# Patient Record
Sex: Female | Born: 1955
Health system: Southern US, Community
[De-identification: ages and names within clinical notes are randomized; demographics above are authoritative.]

---

## 1999-12-31 ENCOUNTER — Encounter: Admission: RE | Admit: 1999-12-31 | Discharge: 1999-12-31 | Payer: Self-pay | Admitting: Family Medicine

## 1999-12-31 ENCOUNTER — Encounter: Payer: Self-pay | Admitting: Family Medicine

## 2000-06-17 ENCOUNTER — Encounter: Admission: RE | Admit: 2000-06-17 | Discharge: 2000-06-17 | Payer: Self-pay | Admitting: Family Medicine

## 2000-06-17 ENCOUNTER — Encounter: Payer: Self-pay | Admitting: Family Medicine

## 2001-08-23 ENCOUNTER — Other Ambulatory Visit: Admission: RE | Admit: 2001-08-23 | Discharge: 2001-08-23 | Payer: Self-pay | Admitting: Gynecology

## 2001-10-25 ENCOUNTER — Encounter: Payer: Self-pay | Admitting: Family Medicine

## 2001-10-25 ENCOUNTER — Encounter: Admission: RE | Admit: 2001-10-25 | Discharge: 2001-10-25 | Payer: Self-pay | Admitting: Family Medicine

## 2003-08-03 ENCOUNTER — Emergency Department (HOSPITAL_COMMUNITY): Admission: AD | Admit: 2003-08-03 | Discharge: 2003-08-03 | Payer: Self-pay | Admitting: Emergency Medicine

## 2004-02-12 ENCOUNTER — Other Ambulatory Visit: Admission: RE | Admit: 2004-02-12 | Discharge: 2004-02-12 | Payer: Self-pay | Admitting: Family Medicine

## 2004-04-22 ENCOUNTER — Encounter: Admission: RE | Admit: 2004-04-22 | Discharge: 2004-04-22 | Payer: Self-pay | Admitting: Family Medicine

## 2004-09-21 ENCOUNTER — Other Ambulatory Visit: Admission: RE | Admit: 2004-09-21 | Discharge: 2004-09-21 | Payer: Self-pay | Admitting: Obstetrics and Gynecology

## 2005-07-29 ENCOUNTER — Other Ambulatory Visit: Admission: RE | Admit: 2005-07-29 | Discharge: 2005-07-29 | Payer: Self-pay | Admitting: Family Medicine

## 2006-09-12 ENCOUNTER — Encounter: Admission: RE | Admit: 2006-09-12 | Discharge: 2006-09-12 | Payer: Self-pay | Admitting: Family Medicine

## 2007-02-17 ENCOUNTER — Other Ambulatory Visit: Admission: RE | Admit: 2007-02-17 | Discharge: 2007-02-17 | Payer: Self-pay | Admitting: Family Medicine

## 2007-10-24 ENCOUNTER — Encounter: Admission: RE | Admit: 2007-10-24 | Discharge: 2007-10-24 | Payer: Self-pay | Admitting: Family Medicine

## 2008-02-21 ENCOUNTER — Other Ambulatory Visit: Admission: RE | Admit: 2008-02-21 | Discharge: 2008-02-21 | Payer: Self-pay | Admitting: Family Medicine

## 2008-10-24 ENCOUNTER — Encounter: Admission: RE | Admit: 2008-10-24 | Discharge: 2008-10-24 | Payer: Self-pay | Admitting: Family Medicine

## 2009-05-08 ENCOUNTER — Other Ambulatory Visit: Admission: RE | Admit: 2009-05-08 | Discharge: 2009-05-08 | Payer: Self-pay | Admitting: Family Medicine

## 2009-10-28 ENCOUNTER — Encounter: Admission: RE | Admit: 2009-10-28 | Discharge: 2009-10-28 | Payer: Self-pay | Admitting: Family Medicine

## 2010-07-24 ENCOUNTER — Other Ambulatory Visit: Admission: RE | Admit: 2010-07-24 | Discharge: 2010-07-24 | Payer: Self-pay | Admitting: Family Medicine

## 2010-12-14 ENCOUNTER — Encounter
Admission: RE | Admit: 2010-12-14 | Discharge: 2010-12-14 | Payer: Self-pay | Source: Home / Self Care | Attending: Family Medicine | Admitting: Family Medicine

## 2011-12-13 ENCOUNTER — Other Ambulatory Visit: Payer: Self-pay | Admitting: Family Medicine

## 2011-12-13 DIAGNOSIS — Z1231 Encounter for screening mammogram for malignant neoplasm of breast: Secondary | ICD-10-CM

## 2011-12-16 ENCOUNTER — Ambulatory Visit
Admission: RE | Admit: 2011-12-16 | Discharge: 2011-12-16 | Disposition: A | Discharge status: Home / Self Care | Payer: BC Managed Care – PPO | Source: Ambulatory Visit | Attending: Family Medicine | Admitting: Family Medicine

## 2011-12-16 DIAGNOSIS — Z1231 Encounter for screening mammogram for malignant neoplasm of breast: Secondary | ICD-10-CM

## 2012-01-16 ENCOUNTER — Ambulatory Visit: Payer: Worker's Compensation

## 2012-11-27 ENCOUNTER — Other Ambulatory Visit: Payer: Self-pay | Admitting: Family Medicine

## 2012-11-27 DIAGNOSIS — Z1231 Encounter for screening mammogram for malignant neoplasm of breast: Secondary | ICD-10-CM

## 2012-12-18 ENCOUNTER — Ambulatory Visit
Admission: RE | Admit: 2012-12-18 | Discharge: 2012-12-18 | Disposition: A | Discharge status: Home / Self Care | Payer: BC Managed Care – PPO | Source: Ambulatory Visit | Attending: Family Medicine | Admitting: Family Medicine

## 2012-12-18 DIAGNOSIS — Z1231 Encounter for screening mammogram for malignant neoplasm of breast: Secondary | ICD-10-CM

## 2013-11-28 ENCOUNTER — Other Ambulatory Visit: Payer: Self-pay

## 2013-11-28 DIAGNOSIS — Z1231 Encounter for screening mammogram for malignant neoplasm of breast: Secondary | ICD-10-CM

## 2013-12-05 ENCOUNTER — Other Ambulatory Visit (HOSPITAL_COMMUNITY)
Admission: RE | Admit: 2013-12-05 | Discharge: 2013-12-05 | Disposition: A | Discharge status: Home / Self Care | Payer: BC Managed Care – PPO | Source: Ambulatory Visit | Attending: Family Medicine | Admitting: Family Medicine

## 2013-12-05 ENCOUNTER — Other Ambulatory Visit: Payer: Self-pay | Admitting: Family Medicine

## 2013-12-05 DIAGNOSIS — Z124 Encounter for screening for malignant neoplasm of cervix: Secondary | ICD-10-CM | POA: Insufficient documentation

## 2013-12-10 ENCOUNTER — Ambulatory Visit: Payer: BC Managed Care – PPO | Attending: Family Medicine | Admitting: Physical Therapy

## 2013-12-10 DIAGNOSIS — M629 Disorder of muscle, unspecified: Secondary | ICD-10-CM | POA: Insufficient documentation

## 2013-12-10 DIAGNOSIS — IMO0001 Reserved for inherently not codable concepts without codable children: Secondary | ICD-10-CM | POA: Insufficient documentation

## 2013-12-10 DIAGNOSIS — M2420 Disorder of ligament, unspecified site: Secondary | ICD-10-CM | POA: Insufficient documentation

## 2013-12-26 ENCOUNTER — Ambulatory Visit: Payer: BC Managed Care – PPO | Admitting: Physical Therapy

## 2013-12-28 ENCOUNTER — Ambulatory Visit
Admission: RE | Admit: 2013-12-28 | Discharge: 2013-12-28 | Disposition: A | Discharge status: Home / Self Care | Payer: BC Managed Care – PPO | Source: Ambulatory Visit

## 2013-12-28 ENCOUNTER — Other Ambulatory Visit: Payer: Self-pay

## 2013-12-28 DIAGNOSIS — Z1231 Encounter for screening mammogram for malignant neoplasm of breast: Secondary | ICD-10-CM

## 2014-01-04 ENCOUNTER — Ambulatory Visit: Payer: BC Managed Care – PPO | Attending: Family Medicine | Admitting: Physical Therapy

## 2014-01-04 DIAGNOSIS — M629 Disorder of muscle, unspecified: Secondary | ICD-10-CM | POA: Insufficient documentation

## 2014-01-04 DIAGNOSIS — M2420 Disorder of ligament, unspecified site: Secondary | ICD-10-CM | POA: Insufficient documentation

## 2014-01-04 DIAGNOSIS — IMO0001 Reserved for inherently not codable concepts without codable children: Secondary | ICD-10-CM | POA: Insufficient documentation

## 2014-01-18 ENCOUNTER — Ambulatory Visit: Payer: BC Managed Care – PPO | Admitting: Physical Therapy

## 2014-01-28 ENCOUNTER — Encounter: Payer: BC Managed Care – PPO | Attending: Family Medicine | Admitting: Dietician

## 2014-01-28 ENCOUNTER — Encounter: Payer: Self-pay | Admitting: Dietician

## 2014-01-28 ENCOUNTER — Ambulatory Visit: Payer: BC Managed Care – PPO | Admitting: Dietician

## 2014-01-28 VITALS — Ht 62.0 in | Wt 167.1 lb

## 2014-01-28 DIAGNOSIS — E669 Obesity, unspecified: Secondary | ICD-10-CM

## 2014-01-28 DIAGNOSIS — R7309 Other abnormal glucose: Secondary | ICD-10-CM | POA: Insufficient documentation

## 2014-01-28 NOTE — Patient Instructions (Addendum)
Goals:  Eat 3 meals/day, Avoid meal skipping   Per meal: 3 carb servings (45 grams per meal).  1 carb serving for snacks   Aim for 30 minutes or more of physical activity daily  Practice reading food labels and counting serving sizes and servings of carbohydrates  Keep checking blood sugars and writing down foods

## 2014-01-28 NOTE — Progress Notes (Signed)
  Medical Nutrition Therapy:  Appt start time: 300 end time: 415   Assessment:  Kaziah is here today for counseling regarding prediabetes diagnosis (HgbA1c of 6.4%). She already has some basic knowledge of diabetes and understands carbohydrate and protein foods. She has already been cutting down on carbs and measuring/recording her blood sugars. She did not bring her records but reports that her fasting BG has been under 100. She states that she has never eaten many sweets.  Preferred Learning Style:    No preference indicated    Learning Readiness:    Ready   MEDICATIONS: none   DIETARY INTAKE:  24-hr recall:  B ( AM): sometimes skips; cereal; egg on biscuit Snk ( AM): salad with meat and [place] dressing;  L ( PM): oranges, apple, rice stew; pizza Snk ( PM): sometimes D (8-9 PM): rice and chicken Jambalaya with yogurt Snk (PM): celery and peanut butter Beverages: 8+ milk, green tea, water  Usual physical activity: walks on the treadmill or outside for 30 minutes 3x a week  Estimated energy needs: 1800 calories  Progress Towards Goal(s):  In progress.   Nutritional Diagnosis:  NI-5.8.2 Excessive carbohydrate intake As related to pt reports high intake of milk and fruit and starches.  As evidenced by Hgb A1c of 6.4%.    Intervention:  Nutrition education provided.  Teaching Method Utilized: Visual Auditory Hands on  Handouts given during visit include:  Carb choices card  Living Well with Diabetes Booklet  Barriers to learning/adherence to lifestyle change: none  Demonstrated degree of understanding via:  Teach Back   Monitoring/Evaluation:  Dietary intake, exercise, carb counting, and body weight in 6 week(s).

## 2014-02-01 ENCOUNTER — Ambulatory Visit: Payer: BC Managed Care – PPO | Attending: Family Medicine | Admitting: Physical Therapy

## 2014-02-01 DIAGNOSIS — IMO0001 Reserved for inherently not codable concepts without codable children: Secondary | ICD-10-CM | POA: Insufficient documentation

## 2014-02-01 DIAGNOSIS — M2420 Disorder of ligament, unspecified site: Secondary | ICD-10-CM | POA: Insufficient documentation

## 2014-02-01 DIAGNOSIS — M629 Disorder of muscle, unspecified: Secondary | ICD-10-CM | POA: Insufficient documentation

## 2014-02-19 ENCOUNTER — Encounter: Payer: Self-pay | Admitting: Family Medicine

## 2015-01-07 ENCOUNTER — Other Ambulatory Visit: Payer: Self-pay

## 2015-01-07 DIAGNOSIS — Z1231 Encounter for screening mammogram for malignant neoplasm of breast: Secondary | ICD-10-CM

## 2015-01-09 ENCOUNTER — Ambulatory Visit
Admission: RE | Admit: 2015-01-09 | Discharge: 2015-01-09 | Disposition: A | Discharge status: Home / Self Care | Payer: BLUE CROSS/BLUE SHIELD | Source: Ambulatory Visit

## 2015-01-09 DIAGNOSIS — Z1231 Encounter for screening mammogram for malignant neoplasm of breast: Secondary | ICD-10-CM

## 2016-02-11 ENCOUNTER — Other Ambulatory Visit: Payer: Self-pay

## 2016-02-11 DIAGNOSIS — Z1231 Encounter for screening mammogram for malignant neoplasm of breast: Secondary | ICD-10-CM

## 2016-02-25 ENCOUNTER — Ambulatory Visit: Payer: BLUE CROSS/BLUE SHIELD

## 2016-03-05 ENCOUNTER — Ambulatory Visit
Admission: RE | Admit: 2016-03-05 | Discharge: 2016-03-05 | Disposition: A | Discharge status: Home / Self Care | Payer: Managed Care, Other (non HMO) | Source: Ambulatory Visit

## 2016-03-05 DIAGNOSIS — Z1231 Encounter for screening mammogram for malignant neoplasm of breast: Secondary | ICD-10-CM

## 2016-05-05 ENCOUNTER — Other Ambulatory Visit: Payer: Self-pay | Admitting: Gastroenterology

## 2017-01-05 ENCOUNTER — Other Ambulatory Visit (HOSPITAL_COMMUNITY)
Admission: RE | Admit: 2017-01-05 | Discharge: 2017-01-05 | Disposition: A | Discharge status: Home / Self Care | Payer: BLUE CROSS/BLUE SHIELD | Source: Ambulatory Visit | Attending: Family Medicine | Admitting: Family Medicine

## 2017-01-05 ENCOUNTER — Other Ambulatory Visit: Payer: Self-pay | Admitting: Family Medicine

## 2017-01-05 DIAGNOSIS — Z01411 Encounter for gynecological examination (general) (routine) with abnormal findings: Secondary | ICD-10-CM | POA: Diagnosis not present

## 2017-01-05 DIAGNOSIS — E7800 Pure hypercholesterolemia, unspecified: Secondary | ICD-10-CM | POA: Diagnosis not present

## 2017-01-05 DIAGNOSIS — E119 Type 2 diabetes mellitus without complications: Secondary | ICD-10-CM | POA: Diagnosis not present

## 2017-01-05 DIAGNOSIS — Z0000 Encounter for general adult medical examination without abnormal findings: Secondary | ICD-10-CM | POA: Diagnosis not present

## 2017-01-07 LAB — CYTOLOGY - PAP: Diagnosis: NEGATIVE

## 2017-04-22 DIAGNOSIS — M545 Low back pain: Secondary | ICD-10-CM | POA: Diagnosis not present

## 2017-04-22 DIAGNOSIS — M1990 Unspecified osteoarthritis, unspecified site: Secondary | ICD-10-CM | POA: Diagnosis not present

## 2017-04-22 DIAGNOSIS — M65311 Trigger thumb, right thumb: Secondary | ICD-10-CM | POA: Diagnosis not present

## 2017-07-04 DIAGNOSIS — E7800 Pure hypercholesterolemia, unspecified: Secondary | ICD-10-CM | POA: Diagnosis not present

## 2017-07-04 DIAGNOSIS — M65311 Trigger thumb, right thumb: Secondary | ICD-10-CM | POA: Diagnosis not present

## 2017-07-04 DIAGNOSIS — E669 Obesity, unspecified: Secondary | ICD-10-CM | POA: Diagnosis not present

## 2017-07-04 DIAGNOSIS — E119 Type 2 diabetes mellitus without complications: Secondary | ICD-10-CM | POA: Diagnosis not present

## 2017-10-10 ENCOUNTER — Ambulatory Visit
Admission: RE | Admit: 2017-10-10 | Discharge: 2017-10-10 | Disposition: A | Discharge status: Home / Self Care | Payer: BLUE CROSS/BLUE SHIELD | Source: Ambulatory Visit | Attending: Family Medicine | Admitting: Family Medicine

## 2017-10-10 ENCOUNTER — Other Ambulatory Visit: Payer: Self-pay | Admitting: Family Medicine

## 2017-10-10 DIAGNOSIS — Z1231 Encounter for screening mammogram for malignant neoplasm of breast: Secondary | ICD-10-CM

## 2017-10-15 ENCOUNTER — Ambulatory Visit (INDEPENDENT_AMBULATORY_CARE_PROVIDER_SITE_OTHER): Payer: Worker's Compensation

## 2017-10-15 ENCOUNTER — Ambulatory Visit (HOSPITAL_COMMUNITY)
Admission: EM | Admit: 2017-10-15 | Discharge: 2017-10-15 | Disposition: A | Discharge status: Home / Self Care | Payer: Worker's Compensation | Attending: Family Medicine | Admitting: Family Medicine

## 2017-10-15 ENCOUNTER — Encounter (HOSPITAL_COMMUNITY): Payer: Self-pay

## 2017-10-15 DIAGNOSIS — M25561 Pain in right knee: Secondary | ICD-10-CM

## 2017-10-15 DIAGNOSIS — S8391XA Sprain of unspecified site of right knee, initial encounter: Secondary | ICD-10-CM

## 2017-10-15 NOTE — ED Triage Notes (Signed)
Pt presents today with a workers comp issue. 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 She is having right knee pain due to slipping forward and catching herself with the right leg.

## 2017-10-15 NOTE — ED Provider Notes (Signed)
[facility]    CSN: [id number] Arrival date & time: 10/15/17  1708     History   Chief Complaint Chief Complaint  Patient presents with  . Knee Pain    HPI Rachael Clayton is a 61 y.o. female.   61 year old female was at work today and she was on a wet floor and slipped. Her right leg went in front of her with her knee flexed and her weight was placed primarily over the right knee. She did not actually hit the floor. The knee did not strike any objects. She states she believes she somehow strained or sprained her knee. She is complaining of right knee pain. She was unable to work the remainder of the shift after this occurred. This is workman'scomp.      History reviewed. No pertinent past medical history.  There are no active problems to display for this patient.   History reviewed. No pertinent surgical history.  OB History    No data available       Home Medications    Prior to Admission medications   Not on File    Family History No family history on file.  Social History Social History  Substance Use Topics  . Smoking status: Never Smoker  . Smokeless tobacco: Never Used  . Alcohol use No     Allergies   Tetracyclines & related   Review of Systems Review of Systems  Constitutional: Negative for activity change, chills and fever.  HENT: Negative.   Respiratory: Negative.   Cardiovascular: Negative.   Musculoskeletal:       As per HPI  Skin: Negative for color change, pallor and rash.  Neurological: Negative.   All other systems reviewed and are negative.    Physical Exam Triage Vital Signs ED Triage Vitals  Enc Vitals Group     BP 10/15/17 1759 135/75     Pulse Rate 10/15/17 1759 65     Resp 10/15/17 1759 16     Temp 10/15/17 1759 98.2 F (36.8 C)     Temp Source 10/15/17 1759 Oral     SpO2 10/15/17 1759 100 %     Weight --      Height --      Head Circumference --      Peak Flow --      Pain Score 10/15/17  1802 8     Pain Loc --      Pain Edu? --      Excl. in [place]? --    No data found.   Updated Vital Signs BP 135/75 (BP Location: Right Arm)   Pulse 65   Temp 98.2 F (36.8 C) (Oral)   Resp 16   SpO2 100%   Visual Acuity Right Eye Distance:   Left Eye Distance:   Bilateral Distance:    Right Eye Near:   Left Eye Near:    Bilateral Near:     Physical Exam  Constitutional: She is oriented to person, place, and time. She appears well-developed and well-nourished. No distress.  HENT:  Head: Normocephalic and atraumatic.  Eyes: EOM are normal. Right eye exhibits no discharge. Left eye exhibits no discharge.  Neck: Neck supple.  Pulmonary/Chest: Effort normal. No respiratory distress.  Musculoskeletal: Normal range of motion. She exhibits no edema.  Right knee with no deformity. No discoloration. There is mild swelling particular he palpated at the medial and lateral joint spaces. No deformity. Extension to 180 without limitation. Flexion to 80.  Palpation reveals tenderness over the lateral tibial condyle and vaguely over the anterior knee. No direct tenderness over the patella. Negative varus, negative valgus, negative drawer.  Neurological: She is alert and oriented to person, place, and time. No cranial nerve deficit.  Skin: Skin is warm and dry.     UC Treatments / Results  Labs (all labs ordered are listed, but only abnormal results are displayed) Labs Reviewed - No data to display  EKG  EKG Interpretation None       Radiology Dg Knee Complete 4 Views Right  Result Date: 10/15/2017 CLINICAL DATA:  Recent slip and fall with right knee pain, initial encounter EXAM: RIGHT KNEE - COMPLETE 4+ VIEW COMPARISON:  None. FINDINGS: Small joint effusion is noted. Tricompartmental degenerative changes are noted. No acute fracture or dislocation is noted. IMPRESSION: Mild degenerative change and small joint effusion. Electronically Signed   By: Inez Catalina M.D.   On:  10/15/2017 19:39    Procedures Procedures (including critical care time)  Medications Ordered in UC Medications - No data to display   Initial Impression / Assessment and Plan / UC Course  I have reviewed the triage vital signs and the nursing notes.  Pertinent labs & imaging results that were available during my care of the patient were reviewed by me and considered in my medical decision making (see chart for details).    Limit weightbearing until you see your orthopedist. In the meantime apply ice to the knee off and on. May take ibuprofen and Tylenol for pain.He may remove the knee brace periodically and bend and straightening her knee to keep it loose.     Final Clinical Impressions(s) / UC Diagnoses   Final diagnoses:  Sprain of right knee, unspecified ligament, initial encounter    New Prescriptions New Prescriptions   No medications on file     Controlled Substance Prescriptions [place] Controlled Substance Registry consulted? Not Applicable   Janne Napoleon, NP 10/15/17 [phone]

## 2017-10-15 NOTE — Discharge Instructions (Signed)
Limit weightbearing until you see your orthopedist. In the meantime apply ice to the knee off and on. May take ibuprofen and Tylenol for pain.He may remove the knee brace periodically and bend and straightening her knee to keep it loose.

## 2017-10-21 ENCOUNTER — Ambulatory Visit (INDEPENDENT_AMBULATORY_CARE_PROVIDER_SITE_OTHER): Payer: Self-pay | Admitting: Orthopaedic Surgery

## 2017-12-06 DIAGNOSIS — E7800 Pure hypercholesterolemia, unspecified: Secondary | ICD-10-CM | POA: Diagnosis not present

## 2018-01-25 DIAGNOSIS — E7800 Pure hypercholesterolemia, unspecified: Secondary | ICD-10-CM | POA: Diagnosis not present

## 2018-01-25 DIAGNOSIS — Z23 Encounter for immunization: Secondary | ICD-10-CM | POA: Diagnosis not present

## 2018-01-25 DIAGNOSIS — E119 Type 2 diabetes mellitus without complications: Secondary | ICD-10-CM | POA: Diagnosis not present

## 2018-01-25 DIAGNOSIS — Z0000 Encounter for general adult medical examination without abnormal findings: Secondary | ICD-10-CM | POA: Diagnosis not present

## 2018-02-07 DIAGNOSIS — E875 Hyperkalemia: Secondary | ICD-10-CM | POA: Diagnosis not present

## 2018-02-07 DIAGNOSIS — E870 Hyperosmolality and hypernatremia: Secondary | ICD-10-CM | POA: Diagnosis not present

## 2018-07-31 DIAGNOSIS — E119 Type 2 diabetes mellitus without complications: Secondary | ICD-10-CM | POA: Diagnosis not present

## 2018-07-31 DIAGNOSIS — E669 Obesity, unspecified: Secondary | ICD-10-CM | POA: Diagnosis not present

## 2018-07-31 DIAGNOSIS — E7800 Pure hypercholesterolemia, unspecified: Secondary | ICD-10-CM | POA: Diagnosis not present

## 2018-09-03 ENCOUNTER — Ambulatory Visit (HOSPITAL_COMMUNITY)
Admission: EM | Admit: 2018-09-03 | Discharge: 2018-09-03 | Disposition: A | Discharge status: Home / Self Care | Payer: Worker's Compensation | Attending: Internal Medicine | Admitting: Internal Medicine

## 2018-09-03 ENCOUNTER — Encounter (HOSPITAL_COMMUNITY): Payer: Self-pay

## 2018-09-03 ENCOUNTER — Ambulatory Visit (INDEPENDENT_AMBULATORY_CARE_PROVIDER_SITE_OTHER): Payer: Worker's Compensation

## 2018-09-03 DIAGNOSIS — M25562 Pain in left knee: Secondary | ICD-10-CM | POA: Diagnosis not present

## 2018-09-03 MED ORDER — NAPROXEN 500 MG PO TABS: 500.0000 mg | ORAL_TABLET | Freq: Two times a day (BID) | ORAL | 0 refills | Status: DC

## 2018-09-03 NOTE — Discharge Instructions (Signed)
Use anti-inflammatories for pain/swelling. You may take up to 800 mg Ibuprofen every 8 hours OR Naprosyn twice daily with food. You may supplement Ibuprofen with Tylenol [phone] mg every 8 hours.   Ice knee, elevate when resting  Please follow-up if pain not improving as expected over the next 1 to 2 weeks, developing new symptoms, worsening pain, leg swelling or redness

## 2018-09-03 NOTE — ED Provider Notes (Signed)
[facility]    CSN: [id number] Arrival date & time: 09/03/18  1049     History   Chief Complaint Chief Complaint  Patient presents with  . Knee Pain    Left    HPI Rachael Clayton is a 62 y.o. female no significant past medical history presenting today for evaluation of left knee injury.  Patient states that yesterday at work she caught her parents on a spinning chair and ended up falling forward.  She landed on her left knee onto cement.  Since she has had pain especially below her left knee and has been limping.  She denies difficulty bending knee, but does have pain with this.  She has been using ice.  Denies previous issues with the knee.  Denies any sensations of popping or instability.  Injury happened Friday, 2 days ago.  HPI  History reviewed. No pertinent past medical history.  There are no active problems to display for this patient.   History reviewed. No pertinent surgical history.  OB History   None      Home Medications    Prior to Admission medications   Medication Sig Start Date End Date Taking? Authorizing Provider  naproxen (NAPROSYN) 500 MG tablet Take 1 tablet (500 mg total) by mouth 2 (two) times daily. 09/03/18   Lamyra Malcolm, Elesa Hacker, PA-C    Family History History reviewed. No pertinent family history.  Social History Social History   Tobacco Use  . Smoking status: Never Smoker  . Smokeless tobacco: Never Used  Substance Use Topics  . Alcohol use: No  . Drug use: No     Allergies   Tetracyclines & related   Review of Systems Review of Systems  Constitutional: Negative for fatigue and fever.  Eyes: Negative for visual disturbance.  Respiratory: Negative for shortness of breath.   Cardiovascular: Negative for chest pain.  Gastrointestinal: Negative for abdominal pain, nausea and vomiting.  Musculoskeletal: Positive for arthralgias, gait problem and joint swelling.  Skin: Positive for color change. Negative for rash and  wound.  Neurological: Negative for dizziness, weakness, light-headedness and headaches.     Physical Exam Triage Vital Signs ED Triage Vitals  Enc Vitals Group     BP 09/03/18 1253 101/68     Pulse Rate 09/03/18 1253 62     Resp 09/03/18 1253 20     Temp 09/03/18 1253 98 F (36.7 C)     Temp Source 09/03/18 1253 Oral     SpO2 09/03/18 1253 97 %     Weight --      Height --      Head Circumference --      Peak Flow --      Pain Score 09/03/18 1252 6     Pain Loc --      Pain Edu? --      Excl. in [place]? --    No data found.  Updated Vital Signs BP 101/68 (BP Location: Right Arm)   Pulse 62   Temp 98 F (36.7 C) (Oral)   Resp 20   SpO2 97%   Visual Acuity Right Eye Distance:   Left Eye Distance:   Bilateral Distance:    Right Eye Near:   Left Eye Near:    Bilateral Near:     Physical Exam  Constitutional: She is oriented to person, place, and time. She appears well-developed and well-nourished.  No acute distress  HENT:  Head: Normocephalic and atraumatic.  Nose: Nose normal.  Eyes: Conjunctivae are normal.  Neck: Neck supple.  Cardiovascular: Normal rate.  Pulmonary/Chest: Effort normal. No respiratory distress.  Abdominal: She exhibits no distension.  Musculoskeletal: Normal range of motion.  Mild bruising and swelling near tibial tubercle below left knee, mild suprapatellar swelling, full active range of motion, no laxity appreciated with varus and valgus stress or with Lachman/anterior drawer.  Knee feels stable.  Antalgic gait  Neurological: She is alert and oriented to person, place, and time.  Skin: Skin is warm and dry.  Psychiatric: She has a normal mood and affect.  Nursing note and vitals reviewed.    UC Treatments / Results  Labs (all labs ordered are listed, but only abnormal results are displayed) Labs Reviewed - No data to display  EKG None  Radiology Dg Knee Complete 4 Views Left  Result Date: 09/03/2018 CLINICAL DATA:  Fall 2  days ago.  Left knee pain. EXAM: LEFT KNEE - COMPLETE 4+ VIEW COMPARISON:  None. FINDINGS: The left located. No acute bone or soft tissue abnormalities are present. Mild degenerative changes are noted in the medial and patellofemoral compartments. IMPRESSION: Mild degenerative change without acute abnormality. Electronically Signed   By: San Morelle M.D.   On: 09/03/2018 13:51    Procedures Procedures (including critical care time)  Medications Ordered in UC Medications - No data to display  Initial Impression / Assessment and Plan / UC Course  I have reviewed the triage vital signs and the nursing notes.  Pertinent labs & imaging results that were available during my care of the patient were reviewed by me and considered in my medical decision making (see chart for details).     Patient likely with contusion of knee from fall, no fracture seen on x-ray.  Cannot fully rule out ligamentous/cartilaginous injury, but knee felt stable and with minimal swelling on exam.  Recommending conservative treatment with anti-inflammatories and ice, expect gradual resolution over the next 1 to 2 weeks.Discussed strict return precautions. Patient verbalized understanding and is agreeable with plan.  Final Clinical Impressions(s) / UC Diagnoses   Final diagnoses:  Acute pain of left knee     Discharge Instructions     Use anti-inflammatories for pain/swelling. You may take up to 800 mg Ibuprofen every 8 hours OR Naprosyn twice daily with food. You may supplement Ibuprofen with Tylenol [phone] mg every 8 hours.   Ice knee, elevate when resting  Please follow-up if pain not improving as expected over the next 1 to 2 weeks, developing new symptoms, worsening pain, leg swelling or redness    ED Prescriptions    Medication Sig Dispense Auth. Provider   naproxen (NAPROSYN) 500 MG tablet Take 1 tablet (500 mg total) by mouth 2 (two) times daily. 30 tablet Kimon Loewen, [place] C, PA-C      Controlled Substance Prescriptions [place] Controlled Substance Registry consulted? Not Applicable   Janith Lima, [place] 09/03/18 1422

## 2018-09-03 NOTE — ED Triage Notes (Signed)
Pt presents with left knee injury from work related injury on 09/01/2018.

## 2018-11-22 ENCOUNTER — Other Ambulatory Visit: Payer: Self-pay | Admitting: Family Medicine

## 2018-11-22 ENCOUNTER — Ambulatory Visit
Admission: RE | Admit: 2018-11-22 | Discharge: 2018-11-22 | Disposition: A | Discharge status: Home / Self Care | Payer: BLUE CROSS/BLUE SHIELD | Source: Ambulatory Visit | Attending: Family Medicine | Admitting: Family Medicine

## 2018-11-22 DIAGNOSIS — Z1231 Encounter for screening mammogram for malignant neoplasm of breast: Secondary | ICD-10-CM | POA: Diagnosis not present

## 2020-01-29 ENCOUNTER — Other Ambulatory Visit: Payer: Self-pay | Admitting: Family Medicine

## 2020-01-29 ENCOUNTER — Ambulatory Visit
Admission: RE | Admit: 2020-01-29 | Discharge: 2020-01-29 | Disposition: A | Discharge status: Home / Self Care | Payer: BC Managed Care – PPO | Source: Ambulatory Visit | Attending: Family Medicine | Admitting: Family Medicine

## 2020-01-29 ENCOUNTER — Other Ambulatory Visit: Payer: Self-pay

## 2020-01-29 DIAGNOSIS — Z1231 Encounter for screening mammogram for malignant neoplasm of breast: Secondary | ICD-10-CM

## 2020-02-19 ENCOUNTER — Other Ambulatory Visit (HOSPITAL_COMMUNITY)
Admission: RE | Admit: 2020-02-19 | Discharge: 2020-02-19 | Disposition: A | Discharge status: Home / Self Care | Payer: BC Managed Care – PPO | Source: Ambulatory Visit | Attending: Family Medicine | Admitting: Family Medicine

## 2020-02-19 DIAGNOSIS — Z124 Encounter for screening for malignant neoplasm of cervix: Secondary | ICD-10-CM | POA: Diagnosis not present

## 2020-02-19 DIAGNOSIS — Z01411 Encounter for gynecological examination (general) (routine) with abnormal findings: Secondary | ICD-10-CM | POA: Insufficient documentation

## 2020-02-19 DIAGNOSIS — Z0000 Encounter for general adult medical examination without abnormal findings: Secondary | ICD-10-CM | POA: Diagnosis not present

## 2020-02-19 DIAGNOSIS — E119 Type 2 diabetes mellitus without complications: Secondary | ICD-10-CM | POA: Diagnosis not present

## 2020-02-19 DIAGNOSIS — E7800 Pure hypercholesterolemia, unspecified: Secondary | ICD-10-CM | POA: Diagnosis not present

## 2020-02-21 LAB — CYTOLOGY - PAP
Comment: NEGATIVE
Diagnosis: NEGATIVE
High risk HPV: NEGATIVE

## 2020-08-19 DIAGNOSIS — E7800 Pure hypercholesterolemia, unspecified: Secondary | ICD-10-CM | POA: Diagnosis not present

## 2020-08-19 DIAGNOSIS — E119 Type 2 diabetes mellitus without complications: Secondary | ICD-10-CM | POA: Diagnosis not present

## 2021-02-05 ENCOUNTER — Other Ambulatory Visit: Payer: Self-pay | Admitting: Family Medicine

## 2021-02-05 DIAGNOSIS — Z1231 Encounter for screening mammogram for malignant neoplasm of breast: Secondary | ICD-10-CM

## 2021-03-27 ENCOUNTER — Ambulatory Visit
Admission: RE | Admit: 2021-03-27 | Discharge: 2021-03-27 | Disposition: A | Discharge status: Home / Self Care | Payer: BC Managed Care – PPO | Source: Ambulatory Visit | Attending: Family Medicine | Admitting: Family Medicine

## 2021-03-27 ENCOUNTER — Other Ambulatory Visit: Payer: Self-pay

## 2021-03-27 DIAGNOSIS — Z1231 Encounter for screening mammogram for malignant neoplasm of breast: Secondary | ICD-10-CM

## 2021-04-08 IMAGING — MG MM DIGITAL SCREENING BILAT W/ TOMO AND CAD
8 series · 9 of 24 positions shown · non-contrast
Comparison: Previous exam(s).

CLINICAL DATA: Screening.

EXAM:
DIGITAL SCREENING BILATERAL MAMMOGRAM WITH TOMOSYNTHESIS AND CAD
TECHNIQUE: Bilateral screening digital craniocaudal and mediolateral oblique
mammograms were obtained. Bilateral screening digital breast
tomosynthesis was performed. The images were evaluated with
computer-aided detection.

[L CC synth-2D]
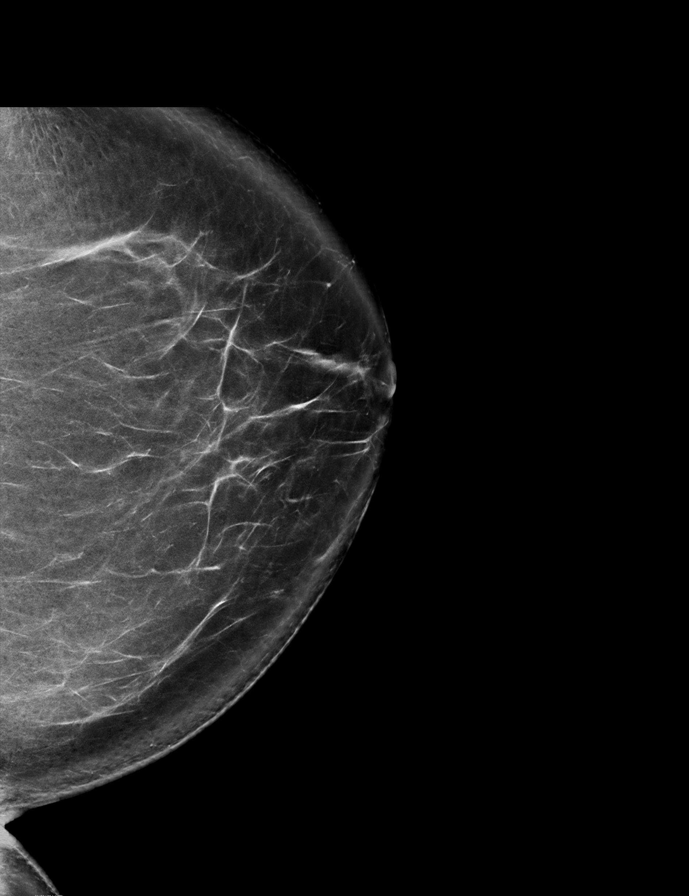

[L MLO synth-2D]
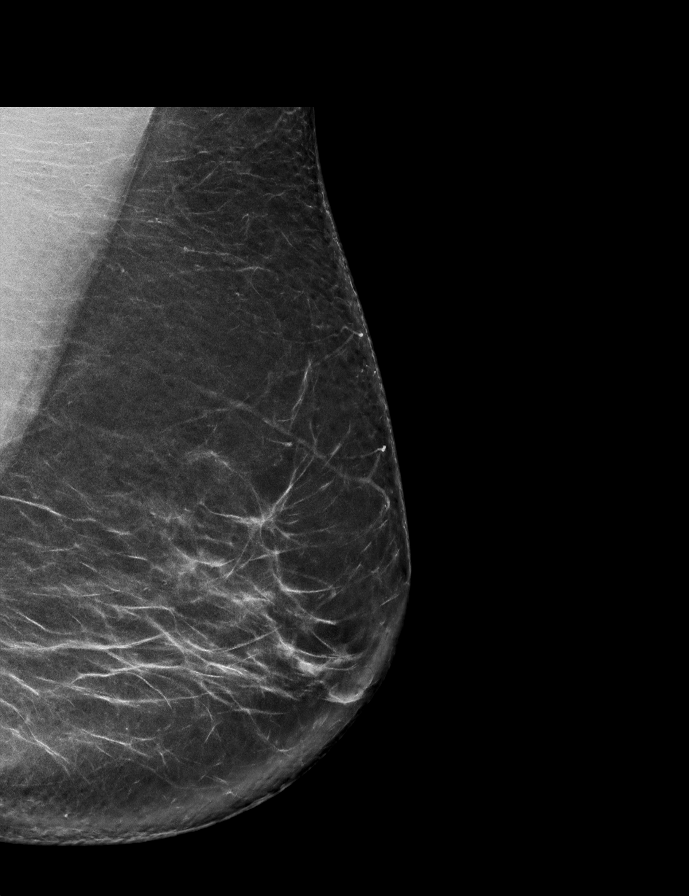

[R CC synth-2D]
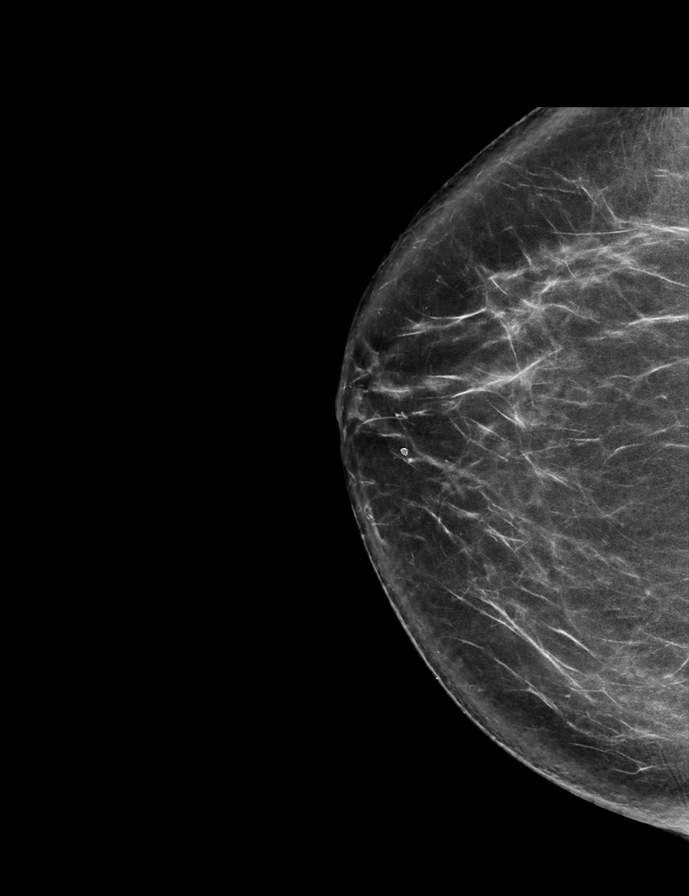

[R MLO synth-2D]
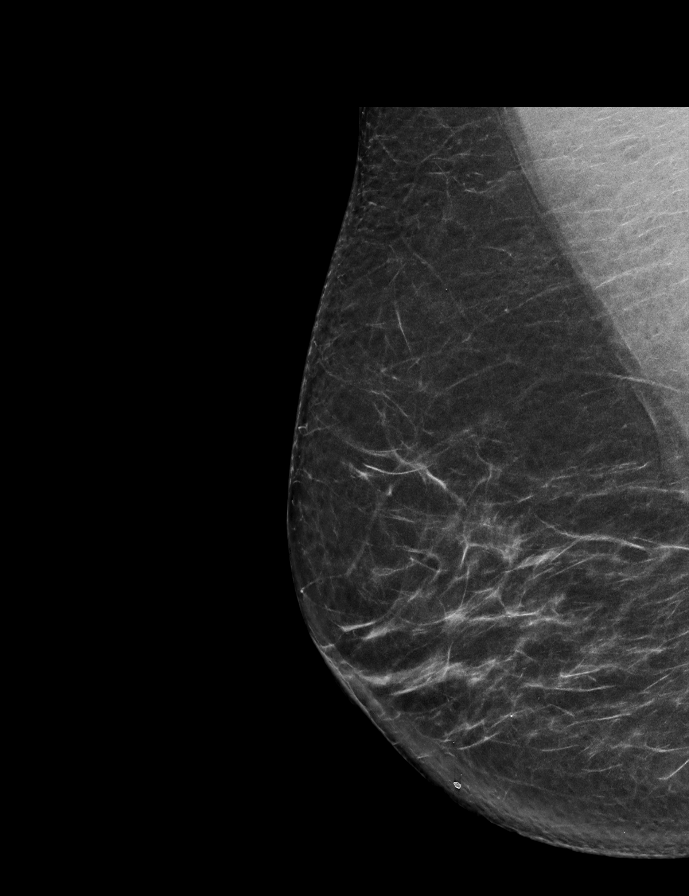

[R MLO tomo · 2 of 75 frames shown]
[frame 25/75]
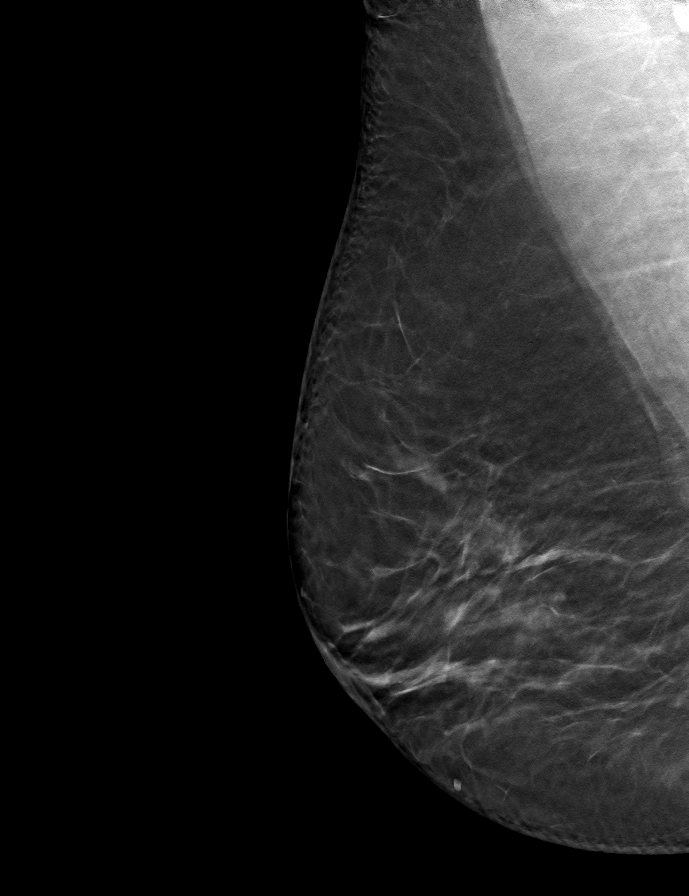
[frame 38/75]
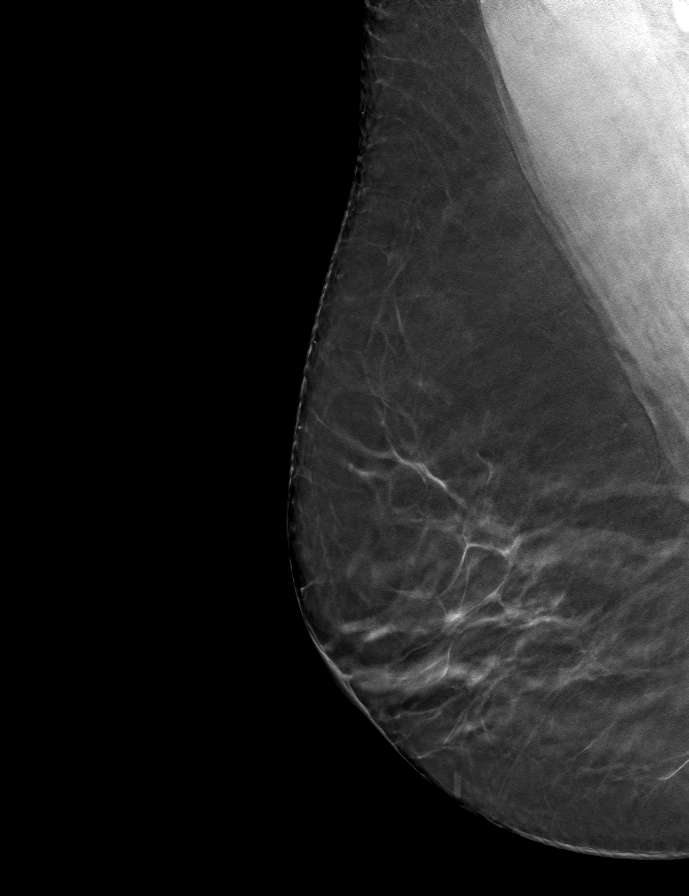

[R CC tomo · tomo slice 41/81.0]
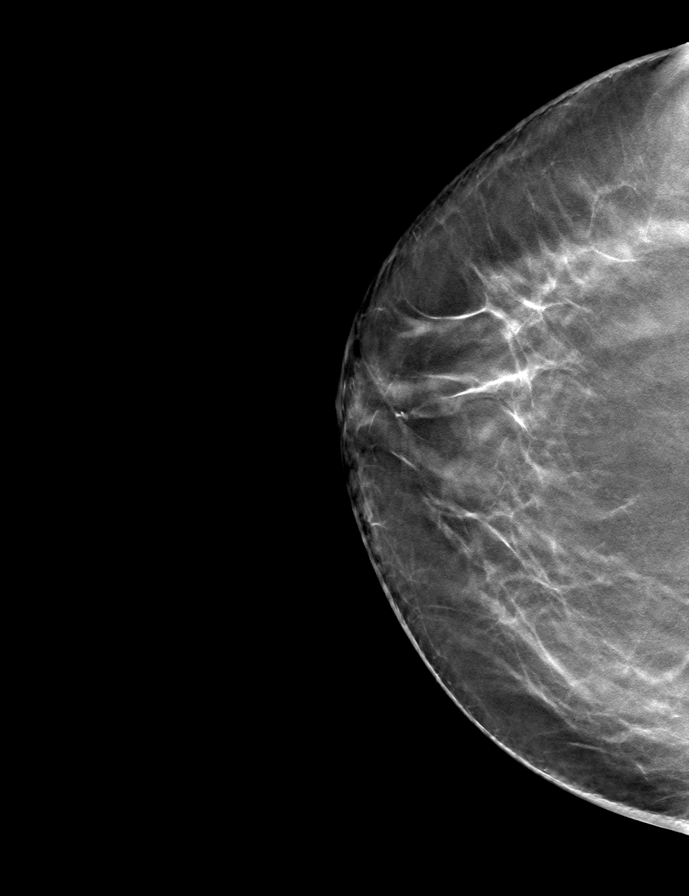

[L CC tomo · tomo slice 43/86.0]
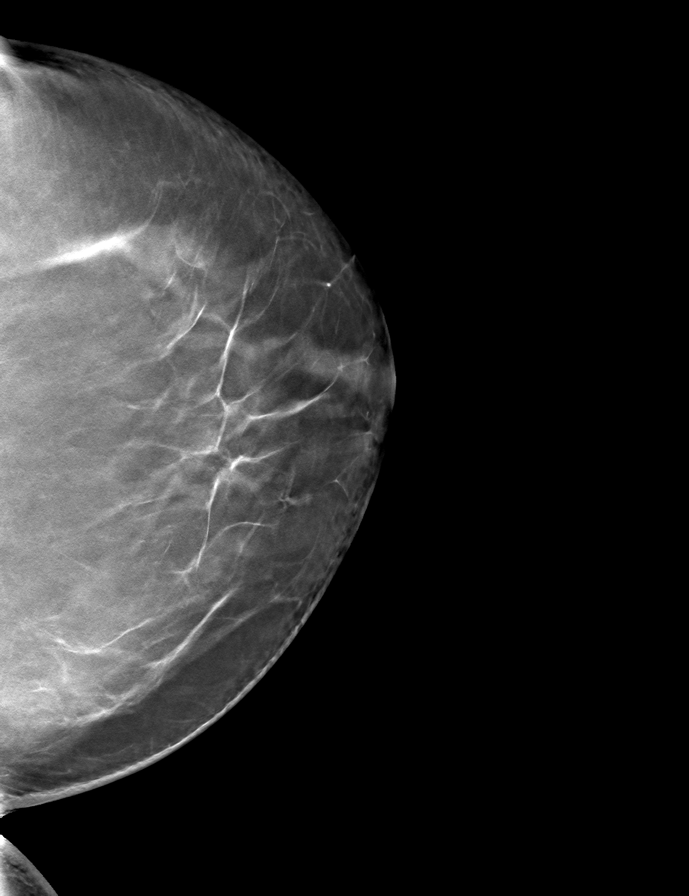

[L MLO tomo · tomo slice 41/82.0]
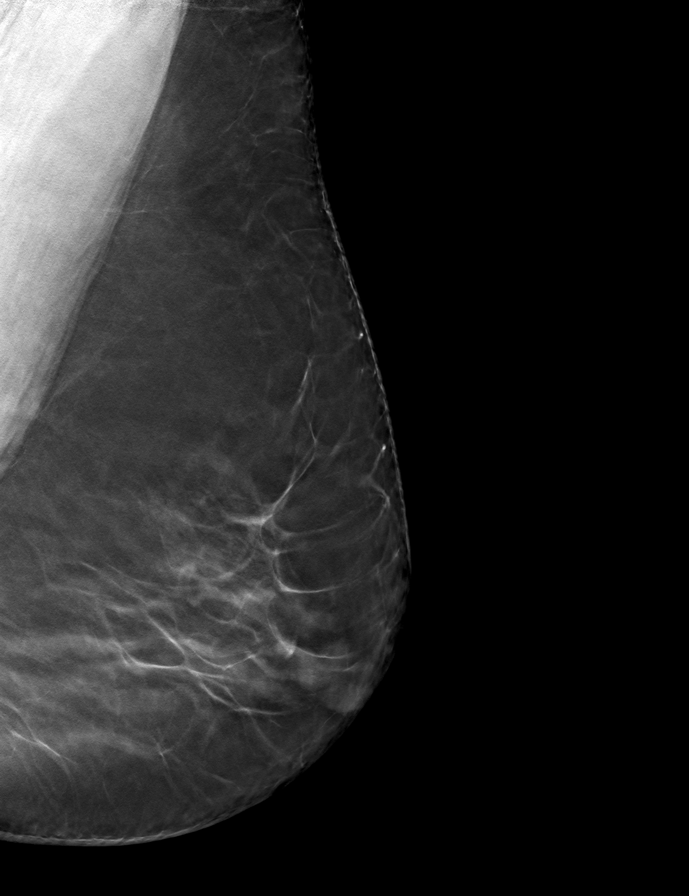

[9 of 24 positions shown; findings below may reference images not displayed]

ACR Breast Density Category b: There are scattered areas of
fibroglandular density.
FINDINGS: There are no findings suspicious for malignancy. The images were
evaluated with computer-aided detection.
IMPRESSION: No mammographic evidence of malignancy. A result letter of this
screening mammogram will be mailed directly to the patient.

RECOMMENDATION:
Screening mammogram in one year. (Code:WJ-I-BG6)

BI-RADS CATEGORY  1: Negative.

## 2021-08-11 ENCOUNTER — Ambulatory Visit: Payer: No Typology Code available for payment source | Admitting: Family Medicine

## 2021-09-08 ENCOUNTER — Encounter: Payer: Self-pay | Admitting: Family Medicine

## 2021-09-08 ENCOUNTER — Ambulatory Visit: Payer: No Typology Code available for payment source | Admitting: Family Medicine

## 2021-09-08 VITALS — BP 110/70 | HR 67 | Ht 63.0 in | Wt 180.6 lb

## 2021-09-08 DIAGNOSIS — Z789 Other specified health status: Secondary | ICD-10-CM

## 2021-09-08 NOTE — Progress Notes (Signed)
  Subjective:     Patient ID: Rachael Clayton, female   DOB: 1956/04/01, 65 y.o.   MRN: [id number]  HPI Rachael Clayton [facility] today for her required wellness visit for her insurance. She has a PCP she sees regularly. She is UTD with her colonoscopy, pap smear, and mammogram. She is working on healthy eating and exercise.   No past medical history on file. Allergies  Allergen Reactions   Tetracyclines & Related    Glucophage [Metformin] Rash    Current Outpatient Medications:    glipiZIDE (GLUCOTROL XL) 5 MG 24 hr tablet, Take 5 mg by mouth daily. Takes Monday, Wednesday, and friday, Disp: , Rfl:    rosuvastatin (CRESTOR) 10 MG tablet, Take by mouth. Take tuesday and saturday, Disp: , Rfl:    naproxen (NAPROSYN) 500 MG tablet, Take 1 tablet (500 mg total) by mouth 2 (two) times daily. (Patient not taking: Reported on 09/08/2021), Disp: 30 tablet, Rfl: 0    Review of Systems  Constitutional:  Negative for chills, fatigue, fever and unexpected weight change.  HENT:  Negative for congestion, ear pain, sinus pressure, sinus pain and sore throat.   Eyes:  Negative for discharge and visual disturbance.  Respiratory:  Negative for cough, shortness of breath and wheezing.   Cardiovascular:  Negative for chest pain and leg swelling.  Gastrointestinal:  Negative for abdominal pain, blood in stool, constipation, diarrhea, nausea and vomiting.  Genitourinary:  Negative for difficulty urinating and hematuria.  Skin:  Negative for color change.  Neurological:  Negative for dizziness, weakness, light-headedness and headaches.  Hematological:  Negative for adenopathy.  All other systems reviewed and are negative.     Objective:   Physical Exam Vitals reviewed.  Constitutional:      General: She is not in acute distress.    Appearance: Normal appearance. She is well-developed.  HENT:     Head: Normocephalic and atraumatic.  Eyes:     General:        Right eye: No  discharge.        Left eye: No discharge.  Cardiovascular:     Rate and Rhythm: Normal rate and regular rhythm.     Heart sounds: Normal heart sounds.  Pulmonary:     Effort: Pulmonary effort is normal. No respiratory distress.     Breath sounds: Normal breath sounds.  Musculoskeletal:     Cervical back: Neck supple.  Skin:    General: Skin is warm and dry.  Neurological:     Mental Status: She is alert and oriented to person, place, and time.  Psychiatric:        Mood and Affect: Mood normal.        Behavior: Behavior normal.   Today's Vitals   09/08/21 1639  BP: 110/70  Pulse: 67  SpO2: 98%  Weight: 180 lb 9.6 oz (81.9 kg)  Height: '5\' 3"'$  (1.6 m)   Body mass index is 31.99 kg/m.     Assessment:     Participant in health and wellness plan     Plan:     Keep all regular appts with PCP. Encouraged healthy eating and exercise. Discussed heart healthy/diabetic diet. F/u here prn.

## 2022-06-25 ENCOUNTER — Other Ambulatory Visit: Payer: Self-pay | Admitting: Family Medicine

## 2022-06-25 DIAGNOSIS — Z1231 Encounter for screening mammogram for malignant neoplasm of breast: Secondary | ICD-10-CM

## 2022-06-28 ENCOUNTER — Ambulatory Visit
Admission: RE | Admit: 2022-06-28 | Discharge: 2022-06-28 | Disposition: A | Discharge status: Home / Self Care | Payer: No Typology Code available for payment source | Source: Ambulatory Visit | Attending: Family Medicine | Admitting: Family Medicine

## 2022-06-28 DIAGNOSIS — Z1231 Encounter for screening mammogram for malignant neoplasm of breast: Secondary | ICD-10-CM

## 2022-08-03 ENCOUNTER — Encounter: Payer: Self-pay | Admitting: Nurse Practitioner

## 2022-08-03 ENCOUNTER — Ambulatory Visit: Payer: No Typology Code available for payment source | Admitting: Nurse Practitioner

## 2022-08-03 VITALS — BP 122/78 | HR 78

## 2022-08-03 DIAGNOSIS — Z789 Other specified health status: Secondary | ICD-10-CM

## 2022-08-03 NOTE — Progress Notes (Signed)
   Subjective:  Patient ID: Rachael Clayton, female    DOB: 1956-06-09  Age: 66 y.o. MRN: [id number]  CC: wellness exam  HPI GINA LEBLOND presents for wellness exam visit for insurance benefit.  Patient has a PCP: Provider Lindell Noe at [facility] significant for: Type 2 DM and HLD on glipizide and crestor Last labs per PCP were completed: Feb 2023, has repeat labs scheduled with PCP in September.   Health Maintenance:  Colonoscopy: June 2023- return in 5 years. Mammogram: July 2023 Pap: 2021    Smoker:Never  Immunizations:  Shingrix-  completed Pneumovax - completed COVID- x 3  Lifestyle: Diet- eating increased fruits and vegetables. Low carb diet.  Exercise- 1-2 a week if able to.      No past medical history on file.  No past surgical history on file.  Outpatient Medications Prior to Visit  Medication Sig Dispense Refill   glipiZIDE (GLUCOTROL XL) 5 MG 24 hr tablet Take 5 mg by mouth daily. Takes Monday, Wednesday, and friday     rosuvastatin (CRESTOR) 10 MG tablet Take by mouth. Take tuesday and saturday     naproxen (NAPROSYN) 500 MG tablet Take 1 tablet (500 mg total) by mouth 2 (two) times daily. 30 tablet 0   No facility-administered medications prior to visit.    ROS Review of Systems  Constitutional:  Negative for activity change.  Respiratory:  Negative for cough and shortness of breath.   Cardiovascular:  Negative for chest pain.  Gastrointestinal:  Negative for constipation and diarrhea.  Endocrine: Negative for polydipsia, polyphagia and polyuria.    Objective:  BP 122/78   Pulse 78   SpO2 96%   Physical Exam Constitutional:      General: She is not in acute distress. HENT:     Head: Normocephalic.  Cardiovascular:     Rate and Rhythm: Normal rate and regular rhythm.     Heart sounds: Normal heart sounds.  Pulmonary:     Effort: Pulmonary effort is normal.     Breath sounds: Normal breath sounds.  Musculoskeletal:         General: Normal range of motion.     Right lower leg: No edema.     Left lower leg: No edema.  Skin:    General: Skin is warm.  Neurological:     General: No focal deficit present.     Mental Status: She is alert and oriented to person, place, and time.  Psychiatric:        Mood and Affect: Mood normal.        Behavior: Behavior normal.      Assessment & Plan:    Valerie was seen today for wellness exam.  Diagnoses and all orders for this visit:  Participant in health and wellness plan Adult wellness physical was conducted today. Importance of diet and exercise were discussed in detail.  We reviewed immunizations and gave recommendations regarding current immunization needed for age.  Preventative health exams needed:Up to date. Follow-up with PCP as recommended.    Patient was advised yearly wellness exam    No orders of the defined types were placed in this encounter.   No orders of the defined types were placed in this encounter.   Follow-up:  As needed.

## 2022-09-06 ENCOUNTER — Other Ambulatory Visit: Payer: Self-pay | Admitting: Family Medicine

## 2022-09-06 DIAGNOSIS — R32 Unspecified urinary incontinence: Secondary | ICD-10-CM

## 2022-09-06 DIAGNOSIS — D219 Benign neoplasm of connective and other soft tissue, unspecified: Secondary | ICD-10-CM

## 2022-09-07 ENCOUNTER — Other Ambulatory Visit: Payer: Self-pay | Admitting: Family Medicine

## 2022-09-07 DIAGNOSIS — R32 Unspecified urinary incontinence: Secondary | ICD-10-CM

## 2022-09-07 DIAGNOSIS — D219 Benign neoplasm of connective and other soft tissue, unspecified: Secondary | ICD-10-CM

## 2022-09-09 ENCOUNTER — Ambulatory Visit
Admission: RE | Admit: 2022-09-09 | Discharge: 2022-09-09 | Disposition: A | Discharge status: Home / Self Care | Payer: No Typology Code available for payment source | Source: Ambulatory Visit | Attending: Family Medicine | Admitting: Family Medicine

## 2022-09-09 DIAGNOSIS — R32 Unspecified urinary incontinence: Secondary | ICD-10-CM

## 2022-09-09 DIAGNOSIS — D219 Benign neoplasm of connective and other soft tissue, unspecified: Secondary | ICD-10-CM

## 2022-09-10 ENCOUNTER — Other Ambulatory Visit: Payer: No Typology Code available for payment source

## 2022-09-14 ENCOUNTER — Ambulatory Visit
Admission: RE | Admit: 2022-09-14 | Discharge: 2022-09-14 | Disposition: A | Discharge status: Home / Self Care | Payer: No Typology Code available for payment source | Source: Ambulatory Visit | Attending: Family Medicine | Admitting: Family Medicine

## 2022-09-14 DIAGNOSIS — R32 Unspecified urinary incontinence: Secondary | ICD-10-CM

## 2022-09-14 DIAGNOSIS — D219 Benign neoplasm of connective and other soft tissue, unspecified: Secondary | ICD-10-CM

## 2023-11-02 DIAGNOSIS — J309 Allergic rhinitis, unspecified: Secondary | ICD-10-CM | POA: Diagnosis not present

## 2023-11-02 DIAGNOSIS — E1165 Type 2 diabetes mellitus with hyperglycemia: Secondary | ICD-10-CM | POA: Diagnosis not present

## 2023-11-02 DIAGNOSIS — E7800 Pure hypercholesterolemia, unspecified: Secondary | ICD-10-CM | POA: Diagnosis not present

## 2023-11-02 DIAGNOSIS — Z23 Encounter for immunization: Secondary | ICD-10-CM | POA: Diagnosis not present

## 2023-11-02 DIAGNOSIS — E119 Type 2 diabetes mellitus without complications: Secondary | ICD-10-CM | POA: Diagnosis not present

## 2024-01-04 DIAGNOSIS — H5213 Myopia, bilateral: Secondary | ICD-10-CM | POA: Diagnosis not present

## 2024-01-04 DIAGNOSIS — H2513 Age-related nuclear cataract, bilateral: Secondary | ICD-10-CM | POA: Diagnosis not present

## 2024-01-04 DIAGNOSIS — H25013 Cortical age-related cataract, bilateral: Secondary | ICD-10-CM | POA: Diagnosis not present

## 2024-01-04 DIAGNOSIS — E119 Type 2 diabetes mellitus without complications: Secondary | ICD-10-CM | POA: Diagnosis not present

## 2024-01-04 DIAGNOSIS — H40013 Open angle with borderline findings, low risk, bilateral: Secondary | ICD-10-CM | POA: Diagnosis not present

## 2024-03-20 ENCOUNTER — Other Ambulatory Visit: Payer: Self-pay | Admitting: Family Medicine

## 2024-03-20 ENCOUNTER — Ambulatory Visit
Admission: RE | Admit: 2024-03-20 | Discharge: 2024-03-20 | Disposition: A | Discharge status: Home / Self Care | Source: Ambulatory Visit | Attending: Family Medicine | Admitting: Family Medicine

## 2024-03-20 DIAGNOSIS — E1165 Type 2 diabetes mellitus with hyperglycemia: Secondary | ICD-10-CM | POA: Diagnosis not present

## 2024-03-20 DIAGNOSIS — Z1231 Encounter for screening mammogram for malignant neoplasm of breast: Secondary | ICD-10-CM

## 2024-03-20 DIAGNOSIS — Z0000 Encounter for general adult medical examination without abnormal findings: Secondary | ICD-10-CM | POA: Diagnosis not present

## 2024-03-20 DIAGNOSIS — E7800 Pure hypercholesterolemia, unspecified: Secondary | ICD-10-CM | POA: Diagnosis not present

## 2024-03-20 DIAGNOSIS — E119 Type 2 diabetes mellitus without complications: Secondary | ICD-10-CM | POA: Diagnosis not present

## 2024-03-21 DIAGNOSIS — Z4689 Encounter for fitting and adjustment of other specified devices: Secondary | ICD-10-CM | POA: Diagnosis not present

## 2024-03-22 ENCOUNTER — Other Ambulatory Visit: Payer: Self-pay | Admitting: Family Medicine

## 2024-03-22 DIAGNOSIS — E2839 Other primary ovarian failure: Secondary | ICD-10-CM

## 2024-03-29 DIAGNOSIS — Z4689 Encounter for fitting and adjustment of other specified devices: Secondary | ICD-10-CM | POA: Diagnosis not present

## 2024-04-23 DIAGNOSIS — Z4689 Encounter for fitting and adjustment of other specified devices: Secondary | ICD-10-CM | POA: Diagnosis not present

## 2024-04-30 DIAGNOSIS — H2000 Unspecified acute and subacute iridocyclitis: Secondary | ICD-10-CM | POA: Diagnosis not present

## 2024-05-04 DIAGNOSIS — H2000 Unspecified acute and subacute iridocyclitis: Secondary | ICD-10-CM | POA: Diagnosis not present

## 2024-07-26 DIAGNOSIS — E1165 Type 2 diabetes mellitus with hyperglycemia: Secondary | ICD-10-CM | POA: Diagnosis not present

## 2024-07-26 DIAGNOSIS — E119 Type 2 diabetes mellitus without complications: Secondary | ICD-10-CM | POA: Diagnosis not present

## 2024-07-26 DIAGNOSIS — Z4689 Encounter for fitting and adjustment of other specified devices: Secondary | ICD-10-CM | POA: Diagnosis not present

## 2024-07-26 DIAGNOSIS — E7800 Pure hypercholesterolemia, unspecified: Secondary | ICD-10-CM | POA: Diagnosis not present

## 2024-08-07 ENCOUNTER — Ambulatory Visit (HOSPITAL_BASED_OUTPATIENT_CLINIC_OR_DEPARTMENT_OTHER)
Admission: RE | Admit: 2024-08-07 | Discharge: 2024-08-07 | Disposition: A | Discharge status: Home / Self Care | Source: Ambulatory Visit | Attending: Family Medicine | Admitting: Family Medicine

## 2024-08-07 DIAGNOSIS — E2839 Other primary ovarian failure: Secondary | ICD-10-CM | POA: Insufficient documentation

## 2024-09-21 DIAGNOSIS — E7800 Pure hypercholesterolemia, unspecified: Secondary | ICD-10-CM | POA: Diagnosis not present

## 2024-09-21 DIAGNOSIS — Z6833 Body mass index (BMI) 33.0-33.9, adult: Secondary | ICD-10-CM | POA: Diagnosis not present

## 2024-09-21 DIAGNOSIS — Z23 Encounter for immunization: Secondary | ICD-10-CM | POA: Diagnosis not present

## 2024-09-21 DIAGNOSIS — E66811 Obesity, class 1: Secondary | ICD-10-CM | POA: Diagnosis not present

## 2024-09-21 DIAGNOSIS — E119 Type 2 diabetes mellitus without complications: Secondary | ICD-10-CM | POA: Diagnosis not present

## 2024-09-25 DIAGNOSIS — E1165 Type 2 diabetes mellitus with hyperglycemia: Secondary | ICD-10-CM | POA: Diagnosis not present

## 2024-09-25 DIAGNOSIS — E7800 Pure hypercholesterolemia, unspecified: Secondary | ICD-10-CM | POA: Diagnosis not present

## 2024-09-25 DIAGNOSIS — E119 Type 2 diabetes mellitus without complications: Secondary | ICD-10-CM | POA: Diagnosis not present

## 2024-11-27 ENCOUNTER — Other Ambulatory Visit
